# Patient Record
Sex: Male | Born: 2000
Health system: Southern US, Community
[De-identification: ages and names within clinical notes are randomized; demographics above are authoritative.]

---
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
## 2019-01-06 DIAGNOSIS — Z20828 Contact with and (suspected) exposure to other viral communicable diseases: Secondary | ICD-10-CM | POA: Diagnosis not present

## 2019-03-04 DIAGNOSIS — F909 Attention-deficit hyperactivity disorder, unspecified type: Secondary | ICD-10-CM | POA: Diagnosis not present

## 2019-05-12 DIAGNOSIS — F909 Attention-deficit hyperactivity disorder, unspecified type: Secondary | ICD-10-CM | POA: Diagnosis not present

## 2019-07-14 DIAGNOSIS — F909 Attention-deficit hyperactivity disorder, unspecified type: Secondary | ICD-10-CM | POA: Diagnosis not present

## 2019-09-08 DIAGNOSIS — F909 Attention-deficit hyperactivity disorder, unspecified type: Secondary | ICD-10-CM | POA: Diagnosis not present

## 2019-11-02 DIAGNOSIS — F909 Attention-deficit hyperactivity disorder, unspecified type: Secondary | ICD-10-CM | POA: Diagnosis not present

## 2019-12-28 DIAGNOSIS — F909 Attention-deficit hyperactivity disorder, unspecified type: Secondary | ICD-10-CM | POA: Diagnosis not present

## 2020-02-11 DIAGNOSIS — R0602 Shortness of breath: Secondary | ICD-10-CM | POA: Diagnosis not present

## 2020-02-11 DIAGNOSIS — E559 Vitamin D deficiency, unspecified: Secondary | ICD-10-CM | POA: Diagnosis not present

## 2020-02-22 DIAGNOSIS — F909 Attention-deficit hyperactivity disorder, unspecified type: Secondary | ICD-10-CM | POA: Diagnosis not present

## 2020-04-01 DIAGNOSIS — Z113 Encounter for screening for infections with a predominantly sexual mode of transmission: Secondary | ICD-10-CM | POA: Diagnosis not present

## 2020-04-01 DIAGNOSIS — Z23 Encounter for immunization: Secondary | ICD-10-CM | POA: Diagnosis not present

## 2020-04-08 DIAGNOSIS — Z23 Encounter for immunization: Secondary | ICD-10-CM | POA: Diagnosis not present

## 2020-05-16 DIAGNOSIS — F909 Attention-deficit hyperactivity disorder, unspecified type: Secondary | ICD-10-CM | POA: Diagnosis not present

## 2020-07-18 DIAGNOSIS — F909 Attention-deficit hyperactivity disorder, unspecified type: Secondary | ICD-10-CM | POA: Diagnosis not present

## 2020-08-25 ENCOUNTER — Encounter: Payer: Self-pay | Admitting: Emergency Medicine

## 2020-08-25 ENCOUNTER — Other Ambulatory Visit: Payer: Self-pay

## 2020-08-25 ENCOUNTER — Ambulatory Visit: Payer: BC Managed Care – PPO | Admitting: Emergency Medicine

## 2020-08-25 VITALS — BP 105/66 | HR 66 | Temp 98.8°F | Resp 16 | Ht 71.0 in | Wt 182.0 lb

## 2020-08-25 DIAGNOSIS — R400 Somnolence: Secondary | ICD-10-CM

## 2020-08-25 DIAGNOSIS — R41840 Attention and concentration deficit: Secondary | ICD-10-CM

## 2020-08-25 DIAGNOSIS — R638 Other symptoms and signs concerning food and fluid intake: Secondary | ICD-10-CM

## 2020-08-25 NOTE — Patient Instructions (Addendum)
   If you have lab work done today you will be contacted with your lab results within the next 2 weeks.  If you have not heard from us then please contact us. The fastest way to get your results is to register for My Chart.   IF you received an x-ray today, you will receive an invoice from [facility]. Please contact [facility] at [phone] with questions or concerns regarding your invoice.   IF you received labwork today, you will receive an invoice from [facility]. Please contact [facility] at [phone] with questions or concerns regarding your invoice.   Our billing staff will not be able to assist you with questions regarding bills from these companies.  You will be contacted with the lab results as soon as they are available. The fastest way to get your results is to activate your My Chart account. Instructions are located on the last page of this paperwork. If you have not heard from us regarding the results in 2 weeks, please contact this office.      Health Maintenance, Male Adopting a healthy lifestyle and getting preventive care are important in promoting health and wellness. Ask your health care provider about:  The right schedule for you to have regular tests and exams.  Things you can do on your own to prevent diseases and keep yourself healthy. What should I know about diet, weight, and exercise? Eat a healthy diet  Eat a diet that includes plenty of vegetables, fruits, low-fat dairy products, and lean protein.  Do not eat a lot of foods that are high in solid fats, added sugars, or sodium.   Maintain a healthy weight Body mass index (BMI) is a measurement that can be used to identify possible weight problems. It estimates body fat based on height and weight. Your health care provider can help determine your BMI and help you achieve or maintain a healthy weight. Get regular exercise Get regular exercise. This is one of the most important things you  can do for your health. Most adults should:  Exercise for at least 150 minutes each week. The exercise should increase your heart rate and make you sweat (moderate-intensity exercise).  Do strengthening exercises at least twice a week. This is in addition to the moderate-intensity exercise.  Spend less time sitting. Even light physical activity can be beneficial. Watch cholesterol and blood lipids Have your blood tested for lipids and cholesterol at 20 years of age, then have this test every 5 years. You may need to have your cholesterol levels checked more often if:  Your lipid or cholesterol levels are high.  You are older than 21 years of age.  You are at high risk for heart disease. What should I know about cancer screening? Many types of cancers can be detected early and may often be prevented. Depending on your health history and family history, you may need to have cancer screening at various ages. This may include screening for:  Colorectal cancer.  Prostate cancer.  Skin cancer.  Lung cancer. What should I know about heart disease, diabetes, and high blood pressure? Blood pressure and heart disease  High blood pressure causes heart disease and increases the risk of stroke. This is more likely to develop in people who have high blood pressure readings, are of African descent, or are overweight.  Talk with your health care provider about your target blood pressure readings.  Have your blood pressure checked: ? Every 3-5 years if you are   18-39 years of age. ? Every year if you are 40 years old or older.  If you are between the ages of 65 and 75 and are a current or former smoker, ask your health care provider if you should have a one-time screening for abdominal aortic aneurysm (AAA). Diabetes Have regular diabetes screenings. This checks your fasting blood sugar level. Have the screening done:  Once every three years after age 45 if you are at a normal weight and have  a low risk for diabetes.  More often and at a younger age if you are overweight or have a high risk for diabetes. What should I know about preventing infection? Hepatitis B If you have a higher risk for hepatitis B, you should be screened for this virus. Talk with your health care provider to find out if you are at risk for hepatitis B infection. Hepatitis C Blood testing is recommended for:  Everyone born from 1945 through 1965.  Anyone with known risk factors for hepatitis C. Sexually transmitted infections (STIs)  You should be screened each year for STIs, including gonorrhea and chlamydia, if: ? You are sexually active and are younger than 21 years of age. ? You are older than 21 years of age and your health care provider tells you that you are at risk for this type of infection. ? Your sexual activity has changed since you were last screened, and you are at increased risk for chlamydia or gonorrhea. Ask your health care provider if you are at risk.  Ask your health care provider about whether you are at high risk for HIV. Your health care provider may recommend a prescription medicine to help prevent HIV infection. If you choose to take medicine to prevent HIV, you should first get tested for HIV. You should then be tested every 3 months for as long as you are taking the medicine. Follow these instructions at home: Lifestyle  Do not use any products that contain nicotine or tobacco, such as cigarettes, e-cigarettes, and chewing tobacco. If you need help quitting, ask your health care provider.  Do not use street drugs.  Do not share needles.  Ask your health care provider for help if you need support or information about quitting drugs. Alcohol use  Do not drink alcohol if your health care provider tells you not to drink.  If you drink alcohol: ? Limit how much you have to 0-2 drinks a day. ? Be aware of how much alcohol is in your drink. In the [place] one drink equals one 12  oz bottle of beer (355 mL), one 5 oz glass of wine (148 mL), or one 1 oz glass of hard liquor (44 mL). General instructions  Schedule regular health, dental, and eye exams.  Stay current with your vaccines.  Tell your health care provider if: ? You often feel depressed. ? You have ever been abused or do not feel safe at home. Summary  Adopting a healthy lifestyle and getting preventive care are important in promoting health and wellness.  Follow your health care provider's instructions about healthy diet, exercising, and getting tested or screened for diseases.  Follow your health care provider's instructions on monitoring your cholesterol and blood pressure. This information is not intended to replace advice given to you by your health care provider. Make sure you discuss any questions you have with your health care provider. Document Revised: 06/04/2018 Document Reviewed: 06/04/2018 Elsevier Patient Education  2021 [employer]  

## 2020-08-25 NOTE — Progress Notes (Signed)
Elijah Murray 20 y.o.   Chief Complaint  Patient presents with  . lab work    Per patient to check thyroid and Vitamin D and sleeping a lot    HISTORY OF PRESENT ILLNESS: This is a 20 y.o. male first visit with me. No known chronic medical problems. Parents concerned about excessive daytime sleeping, laziness, cravings for sweets, problems concentrating, anger issues. Patient states he has cravings for sweets and erratic pattern of sleep.  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  HPI   Prior to Admission medications   Medication Sig Start Date End Date Taking? Authorizing Provider  cyclobenzaprine (FLEXERIL) 5 MG tablet Take 1 tablet (5 mg total) by mouth 3 (three) times daily as needed for muscle spasms. Patient not taking: Reported on 08/25/2020 11/02/16   Ofilia Neas, PA-C  naproxen (NAPROSYN) 500 MG tablet Take 1 tablet (500 mg total) by mouth 2 (two) times daily with a meal. Patient not taking: Reported on 08/25/2020 11/02/16   Ofilia Neas, PA-C    No Known Allergies  There are no problems to display for this patient.   No past medical history on file.    Social History   Socioeconomic History  . Marital status: Single    Spouse name: Not on file  . Number of children: Not on file  . Years of education: Not on file  . Highest education level: Not on file  Occupational History  . Not on file  Tobacco Use  . Smoking status: Never Smoker  . Smokeless tobacco: Never Used  Substance and Sexual Activity  . Alcohol use: No  . Drug use: No  . Sexual activity: Not on file  Other Topics Concern  . Not on file  Social History Narrative  . Not on file   Social Determinants of Health   Financial Resource Strain: Not on file  Food Insecurity: Not on file  Transportation Needs: Not on file  Physical Activity: Not on file  Stress: Not on file  Social Connections: Not on file   Intimate Partner Violence: Not on file    No family history on file.   Review of Systems  Constitutional: Negative.  Negative for chills and fever.  HENT: Negative.  Negative for congestion and sore throat.   Respiratory: Negative.  Negative for cough and shortness of breath.   Cardiovascular: Negative.  Negative for chest pain and palpitations.  Gastrointestinal: Negative.  Negative for abdominal pain, diarrhea, nausea and vomiting.  Genitourinary: Negative.  Negative for hematuria.  Musculoskeletal: Negative for back pain, joint pain and myalgias.  Skin: Negative.  Negative for rash.  Neurological: Negative.  Negative for dizziness, speech change, focal weakness, loss of consciousness and headaches.  Psychiatric/Behavioral: Negative for depression and substance abuse. The patient is not nervous/anxious.   All other systems reviewed and are negative.    Today's Vitals   08/25/20 1528  BP: 105/66  Pulse: 66  Resp: 16  Temp: 98.8 F (37.1 C)  TempSrc: Temporal  SpO2: 98%  Weight: 182 lb (82.6 kg)  Height: 5\' 11"  (1.803 m)   Body mass index is 25.38 kg/m.   Physical Exam Vitals reviewed.  Constitutional:      Appearance: Normal appearance.  HENT:     Head: Normocephalic.     Mouth/Throat:     Mouth: Mucous membranes are moist.     Pharynx: Oropharynx is clear.  Eyes:  Extraocular Movements: Extraocular movements intact.     Conjunctiva/sclera: Conjunctivae normal.     Pupils: Pupils are equal, round, and reactive to light.  Cardiovascular:     Rate and Rhythm: Normal rate and regular rhythm.     Pulses: Normal pulses.     Heart sounds: Normal heart sounds.  Pulmonary:     Effort: Pulmonary effort is normal.     Breath sounds: Normal breath sounds.  Musculoskeletal:        General: Normal range of motion.     Cervical back: Normal range of motion and neck supple. No tenderness.     Right lower leg: No edema.     Left lower leg: No edema.   Lymphadenopathy:     Cervical: No cervical adenopathy.  Skin:    General: Skin is warm and dry.     Capillary Refill: Capillary refill takes less than 2 seconds.  Neurological:     General: No focal deficit present.     Mental Status: He is alert and oriented to person, place, and time.  Psychiatric:        Mood and Affect: Mood normal.        Behavior: Behavior normal.      ASSESSMENT & PLAN: Elijah Murray was seen today for lab work.  Diagnoses and all orders for this visit:  Craving for particular food -     VITAMIN D 25 Hydroxy (Vit-D Deficiency, Fractures) -     Vitamin B12 -     TSH -     Hemoglobin A1c -     Comprehensive metabolic panel -     CBC with Differential/Platelet  Daytime sleepiness -     VITAMIN D 25 Hydroxy (Vit-D Deficiency, Fractures) -     Vitamin B12 -     TSH -     Hemoglobin A1c -     Comprehensive metabolic panel -     CBC with Differential/Platelet  Concentration deficit -     VITAMIN D 25 Hydroxy (Vit-D Deficiency, Fractures) -     Vitamin B12 -     TSH -     Hemoglobin A1c -     Comprehensive metabolic panel -     CBC with Differential/Platelet    Patient Instructions       If you have lab work done today you will be contacted with your lab results within the next 2 weeks.  If you have not heard from [place] then please contact us. The fastest way to get your results is to register for My Chart.   IF you received an x-ray today, you will receive an invoice from [facility]. Please contact [facility] at [phone] with questions or concerns regarding your invoice.   IF you received labwork today, you will receive an invoice from [place]. Please contact [facility] at [phone] with questions or concerns regarding your invoice.   Our billing staff will not be able to assist you with questions regarding bills from these companies.  You will be contacted with the lab results as soon as they are available. The fastest  way to get your results is to activate your My Chart account. Instructions are located on the last page of this paperwork. If you have not heard from [place] regarding the results in 2 weeks, please contact this office.     Health Maintenance, Male Adopting a healthy lifestyle and getting preventive care are important in promoting health and wellness. Ask your health care provider  about:  The right schedule for you to have regular tests and exams.  Things you can do on your own to prevent diseases and keep yourself healthy. What should I know about diet, weight, and exercise? Eat a healthy diet  Eat a diet that includes plenty of vegetables, fruits, low-fat dairy products, and lean protein.  Do not eat a lot of foods that are high in solid fats, added sugars, or sodium.   Maintain a healthy weight Body mass index (BMI) is a measurement that can be used to identify possible weight problems. It estimates body fat based on height and weight. Your health care provider can help determine your BMI and help you achieve or maintain a healthy weight. Get regular exercise Get regular exercise. This is one of the most important things you can do for your health. Most adults should:  Exercise for at least 150 minutes each week. The exercise should increase your heart rate and make you sweat (moderate-intensity exercise).  Do strengthening exercises at least twice a week. This is in addition to the moderate-intensity exercise.  Spend less time sitting. Even light physical activity can be beneficial. Watch cholesterol and blood lipids Have your blood tested for lipids and cholesterol at 21 years of age, then have this test every 5 years. You may need to have your cholesterol levels checked more often if:  Your lipid or cholesterol levels are high.  You are older than 21 years of age.  You are at high risk for heart disease. What should I know about cancer screening? Many types of cancers can be  detected early and may often be prevented. Depending on your health history and family history, you may need to have cancer screening at various ages. This may include screening for:  Colorectal cancer.  Prostate cancer.  Skin cancer.  Lung cancer. What should I know about heart disease, diabetes, and high blood pressure? Blood pressure and heart disease  High blood pressure causes heart disease and increases the risk of stroke. This is more likely to develop in people who have high blood pressure readings, are of African descent, or are overweight.  Talk with your health care provider about your target blood pressure readings.  Have your blood pressure checked: ? Every 3-5 years if you are 3718-21 years of age. ? Every year if you are 21 years old or older.  If you are between the ages of 2365 and 8275 and are a current or former smoker, ask your health care provider if you should have a one-time screening for abdominal aortic aneurysm (AAA). Diabetes Have regular diabetes screenings. This checks your fasting blood sugar level. Have the screening done:  Once every three years after age 21 if you are at a normal weight and have a low risk for diabetes.  More often and at a younger age if you are overweight or have a high risk for diabetes. What should I know about preventing infection? Hepatitis B If you have a higher risk for hepatitis B, you should be screened for this virus. Talk with your health care provider to find out if you are at risk for hepatitis B infection. Hepatitis C Blood testing is recommended for:  Everyone born from [birth year] through 1966.  Anyone with known risk factors for hepatitis C. Sexually transmitted infections (STIs)  You should be screened each year for STIs, including gonorrhea and chlamydia, if: ? You are sexually active and are younger than 21 years of age. ? You  are older than 21 years of age and your health care provider tells you that you are at risk  for this type of infection. ? Your sexual activity has changed since you were last screened, and you are at increased risk for chlamydia or gonorrhea. Ask your health care provider if you are at risk.  Ask your health care provider about whether you are at high risk for HIV. Your health care provider may recommend a prescription medicine to help prevent HIV infection. If you choose to take medicine to prevent HIV, you should first get tested for HIV. You should then be tested every 3 months for as long as you are taking the medicine. Follow these instructions at home: Lifestyle  Do not use any products that contain nicotine or tobacco, such as cigarettes, e-cigarettes, and chewing tobacco. If you need help quitting, ask your health care provider.  Do not use street drugs.  Do not share needles.  Ask your health care provider for help if you need support or information about quitting drugs. Alcohol use  Do not drink alcohol if your health care provider tells you not to drink.  If you drink alcohol: ? Limit how much you have to 0-2 drinks a day. ? Be aware of how much alcohol is in your drink. In the [place] one drink equals one 12 oz bottle of beer (355 mL), one 5 oz glass of wine (148 mL), or one 1 oz glass of hard liquor (44 mL). General instructions  Schedule regular health, dental, and eye exams.  Stay current with your vaccines.  Tell your health care provider if: ? You often feel depressed. ? You have ever been abused or do not feel safe at home. Summary  Adopting a healthy lifestyle and getting preventive care are important in promoting health and wellness.  Follow your health care provider's instructions about healthy diet, exercising, and getting tested or screened for diseases.  Follow your health care provider's instructions on monitoring your cholesterol and blood pressure. This information is not intended to replace advice given to you by your health care provider.  Make sure you discuss any questions you have with your health care provider. Document Revised: 06/04/2018 Document Reviewed: 06/04/2018 Elsevier Patient Education  2021 Elsevier Inc.      Edwina Barth, MD [facility]

## 2020-08-26 LAB — HEMOGLOBIN A1C
Est. average glucose Bld gHb Est-mCnc: 103 mg/dL
Hgb A1c MFr Bld: 5.2 % (ref 4.8–5.6)

## 2020-08-26 LAB — CBC WITH DIFFERENTIAL/PLATELET
Basophils Absolute: 0 10*3/uL (ref 0.0–0.2)
Basos: 1 %
EOS (ABSOLUTE): 0.1 10*3/uL (ref 0.0–0.4)
Eos: 2 %
Hematocrit: 44.8 % (ref 37.5–51.0)
Hemoglobin: 15.2 g/dL (ref 13.0–17.7)
Immature Grans (Abs): 0 10*3/uL (ref 0.0–0.1)
Immature Granulocytes: 0 %
Lymphocytes Absolute: 2.2 10*3/uL (ref 0.7–3.1)
Lymphs: 34 %
MCH: 28.2 pg (ref 26.6–33.0)
MCHC: 33.9 g/dL (ref 31.5–35.7)
MCV: 83 fL (ref 79–97)
Monocytes Absolute: 0.6 10*3/uL (ref 0.1–0.9)
Monocytes: 9 %
Neutrophils Absolute: 3.6 10*3/uL (ref 1.4–7.0)
Neutrophils: 54 %
Platelets: 222 10*3/uL (ref 150–450)
RBC: 5.39 x10E6/uL (ref 4.14–5.80)
RDW: 12.8 % (ref 11.6–15.4)
WBC: 6.6 10*3/uL (ref 3.4–10.8)

## 2020-08-26 LAB — TSH: TSH: 1.52 u[IU]/mL (ref 0.450–4.500)

## 2020-08-26 LAB — COMPREHENSIVE METABOLIC PANEL
ALT: 27 IU/L (ref 0–44)
AST: 28 IU/L (ref 0–40)
Albumin/Globulin Ratio: 1.8 (ref 1.2–2.2)
Albumin: 4.7 g/dL (ref 4.1–5.2)
Alkaline Phosphatase: 103 IU/L (ref 51–125)
BUN/Creatinine Ratio: 11 (ref 9–20)
BUN: 10 mg/dL (ref 6–20)
Bilirubin Total: 0.3 mg/dL (ref 0.0–1.2)
CO2: 20 mmol/L (ref 20–29)
Calcium: 9.8 mg/dL (ref 8.7–10.2)
Chloride: 101 mmol/L (ref 96–106)
Creatinine, Ser: 0.95 mg/dL (ref 0.76–1.27)
Globulin, Total: 2.6 g/dL (ref 1.5–4.5)
Glucose: 90 mg/dL (ref 65–99)
Potassium: 4.4 mmol/L (ref 3.5–5.2)
Sodium: 141 mmol/L (ref 134–144)
Total Protein: 7.3 g/dL (ref 6.0–8.5)
eGFR: 118 mL/min/{1.73_m2} (ref >59)

## 2020-08-26 LAB — VITAMIN B12: Vitamin B-12: 327 pg/mL (ref 232–1245)

## 2020-08-26 LAB — VITAMIN D 25 HYDROXY (VIT D DEFICIENCY, FRACTURES): Vit D, 25-Hydroxy: 39.7 ng/mL (ref 30.0–100.0)

## 2020-08-30 ENCOUNTER — Telehealth: Payer: Self-pay | Admitting: Emergency Medicine

## 2020-08-30 NOTE — Telephone Encounter (Signed)
Patients father is calling in asking for recent lab results .  I did not see a current DPR please reach out to  patient  With recent lab results   [phone] dads number  Dad is aware that we will need to speak with son

## 2020-09-12 DIAGNOSIS — F909 Attention-deficit hyperactivity disorder, unspecified type: Secondary | ICD-10-CM | POA: Diagnosis not present

## 2021-03-01 DIAGNOSIS — F909 Attention-deficit hyperactivity disorder, unspecified type: Secondary | ICD-10-CM | POA: Diagnosis not present

## 2021-04-26 DIAGNOSIS — F3481 Disruptive mood dysregulation disorder: Secondary | ICD-10-CM | POA: Diagnosis not present

## 2021-06-27 ENCOUNTER — Encounter: Payer: Self-pay | Admitting: Emergency Medicine

## 2021-06-27 ENCOUNTER — Other Ambulatory Visit: Payer: Self-pay

## 2021-06-27 ENCOUNTER — Ambulatory Visit (INDEPENDENT_AMBULATORY_CARE_PROVIDER_SITE_OTHER): Payer: BC Managed Care – PPO | Admitting: Emergency Medicine

## 2021-06-27 VITALS — BP 118/60 | HR 70 | Ht 71.0 in | Wt 181.0 lb

## 2021-06-27 DIAGNOSIS — Z13228 Encounter for screening for other metabolic disorders: Secondary | ICD-10-CM

## 2021-06-27 DIAGNOSIS — Z0000 Encounter for general adult medical examination without abnormal findings: Secondary | ICD-10-CM | POA: Diagnosis not present

## 2021-06-27 DIAGNOSIS — Z13220 Encounter for screening for lipoid disorders: Secondary | ICD-10-CM

## 2021-06-27 DIAGNOSIS — Z130 Encounter for screening for diseases of the blood and blood-forming organs and certain disorders involving the immune mechanism: Secondary | ICD-10-CM | POA: Diagnosis not present

## 2021-06-27 DIAGNOSIS — Z1329 Encounter for screening for other suspected endocrine disorder: Secondary | ICD-10-CM | POA: Diagnosis not present

## 2021-06-27 DIAGNOSIS — Z1159 Encounter for screening for other viral diseases: Secondary | ICD-10-CM | POA: Diagnosis not present

## 2021-06-27 DIAGNOSIS — Z114 Encounter for screening for human immunodeficiency virus [HIV]: Secondary | ICD-10-CM | POA: Diagnosis not present

## 2021-06-27 LAB — CBC WITH DIFFERENTIAL/PLATELET
Basophils Absolute: 0 10*3/uL (ref 0.0–0.1)
Basophils Relative: 0.3 % (ref 0.0–3.0)
Eosinophils Absolute: 0.1 10*3/uL (ref 0.0–0.7)
Eosinophils Relative: 1 % (ref 0.0–5.0)
HCT: 44.2 % (ref 39.0–52.0)
Hemoglobin: 15.1 g/dL (ref 13.0–17.0)
Lymphocytes Relative: 27 % (ref 12.0–46.0)
Lymphs Abs: 1.8 10*3/uL (ref 0.7–4.0)
MCHC: 34.1 g/dL (ref 30.0–36.0)
MCV: 81 fl (ref 78.0–100.0)
Monocytes Absolute: 0.6 10*3/uL (ref 0.1–1.0)
Monocytes Relative: 9.6 % (ref 3.0–12.0)
Neutro Abs: 4.2 10*3/uL (ref 1.4–7.7)
Neutrophils Relative %: 62.1 % (ref 43.0–77.0)
Platelets: 219 10*3/uL (ref 150.0–400.0)
RBC: 5.46 Mil/uL (ref 4.22–5.81)
RDW: 13.6 % (ref 11.5–15.5)
WBC: 6.7 10*3/uL (ref 4.0–10.5)

## 2021-06-27 LAB — COMPREHENSIVE METABOLIC PANEL
ALT: 23 U/L (ref 0–53)
AST: 22 U/L (ref 0–37)
Albumin: 4.9 g/dL (ref 3.5–5.2)
Alkaline Phosphatase: 96 U/L (ref 39–117)
BUN: 9 mg/dL (ref 6–23)
CO2: 28 mEq/L (ref 19–32)
Calcium: 9.9 mg/dL (ref 8.4–10.5)
Chloride: 101 mEq/L (ref 96–112)
Creatinine, Ser: 0.95 mg/dL (ref 0.40–1.50)
GFR: 114.73 mL/min (ref >60.00)
Glucose, Bld: 81 mg/dL (ref 70–99)
Potassium: 3.7 mEq/L (ref 3.5–5.1)
Sodium: 138 mEq/L (ref 135–145)
Total Bilirubin: 0.5 mg/dL (ref 0.2–1.2)
Total Protein: 7.9 g/dL (ref 6.0–8.3)

## 2021-06-27 LAB — HEMOGLOBIN A1C: Hgb A1c MFr Bld: 5.2 % (ref 4.6–6.5)

## 2021-06-27 LAB — LIPID PANEL
Cholesterol: 179 mg/dL (ref 0–200)
HDL: 36.3 mg/dL — ABNORMAL LOW (ref >39.00)
LDL Cholesterol: 104 mg/dL — ABNORMAL HIGH (ref 0–99)
NonHDL: 143.14
Total CHOL/HDL Ratio: 5
Triglycerides: 194 mg/dL — ABNORMAL HIGH (ref 0.0–149.0)
VLDL: 38.8 mg/dL (ref 0.0–40.0)

## 2021-06-27 NOTE — Patient Instructions (Signed)

## 2021-06-27 NOTE — Progress Notes (Signed)
Elijah Murray 20 y.o.   Chief Complaint  Patient presents with   New Patient (Initial Visit)    physical    HISTORY OF PRESENT ILLNESS: This is a 21 y.o. male here for annual physical. Healthy male with a healthy lifestyle.  HPI   Prior to Admission medications   Medication Sig Start Date End Date Taking? Authorizing Provider  cyclobenzaprine (FLEXERIL) 5 MG tablet Take 1 tablet (5 mg total) by mouth 3 (three) times daily as needed for muscle spasms. Patient not taking: Reported on 08/25/2020 11/02/16   Ofilia Neas, PA-C  naproxen (NAPROSYN) 500 MG tablet Take 1 tablet (500 mg total) by mouth 2 (two) times daily with a meal. Patient not taking: Reported on 08/25/2020 11/02/16   Ofilia Neas, PA-C    No Known Allergies  There are no problems to display for this patient.   No past medical history on file.  No past surgical history on file.  Social History   Socioeconomic History   Marital status: Single    Spouse name: Not on file   Number of children: Not on file   Years of education: Not on file   Highest education level: Not on file  Occupational History   Not on file  Tobacco Use   Smoking status: Never   Smokeless tobacco: Never  Substance and Sexual Activity   Alcohol use: No   Drug use: No   Sexual activity: Not on file  Other Topics Concern   Not on file  Social History Narrative   Not on file   Social Determinants of Health   Financial Resource Strain: Not on file  Food Insecurity: Not on file  Transportation Needs: Not on file  Physical Activity: Not on file  Stress: Not on file  Social Connections: Not on file  Intimate Partner Violence: Not on file    No family history on file.   Review of Systems  Constitutional: Negative.  Negative for chills and fever.  HENT: Negative.  Negative for congestion and sore throat.   Eyes: Negative.   Respiratory: Negative.  Negative for cough and shortness of breath.   Cardiovascular: Negative.   Negative for chest pain and palpitations.  Gastrointestinal: Negative.  Negative for abdominal pain, diarrhea, nausea and vomiting.  Genitourinary: Negative.   Skin: Negative.  Negative for rash.  Neurological: Negative.  Negative for dizziness and headaches.  All other systems reviewed and are negative.   Physical Exam Vitals reviewed.  Constitutional:      Appearance: Normal appearance.  HENT:     Head: Normocephalic.     Right Ear: Tympanic membrane, ear canal and external ear normal.     Left Ear: Tympanic membrane, ear canal and external ear normal.     Mouth/Throat:     Mouth: Mucous membranes are moist.     Pharynx: Oropharynx is clear.  Eyes:     Extraocular Movements: Extraocular movements intact.     Conjunctiva/sclera: Conjunctivae normal.     Pupils: Pupils are equal, round, and reactive to light.  Cardiovascular:     Rate and Rhythm: Normal rate and regular rhythm.     Pulses: Normal pulses.     Heart sounds: Normal heart sounds.  Pulmonary:     Effort: Pulmonary effort is normal.     Breath sounds: Normal breath sounds.  Musculoskeletal:        General: Normal range of motion.     Cervical back: Normal range of motion and  neck supple. No tenderness.     Right lower leg: No edema.     Left lower leg: No edema.  Lymphadenopathy:     Cervical: No cervical adenopathy.  Skin:    General: Skin is warm and dry.     Capillary Refill: Capillary refill takes less than 2 seconds.  Neurological:     General: No focal deficit present.     Mental Status: He is alert and oriented to person, place, and time.  Psychiatric:        Mood and Affect: Mood normal.        Behavior: Behavior normal.     ASSESSMENT & PLAN: Problem List Items Addressed This Visit   None Visit Diagnoses     Routine general medical examination at a health care facility    -  Primary   Need for hepatitis C screening test       Relevant Orders   Hepatitis C antibody screen   Screening for  HIV (human immunodeficiency virus)       Relevant Orders   HIV antibody   Screening for deficiency anemia       Relevant Orders   CBC with Differential   Screening for lipoid disorders       Relevant Orders   Lipid panel   Screening for endocrine, metabolic and immunity disorder       Relevant Orders   Comprehensive metabolic panel   Hemoglobin A1c      Modifiable risk factors discussed with patient. Anticipatory guidance according to age provided. The following topics were also discussed: Social Determinants of Health Smoking.  Non-smoker Diet and nutrition Benefits of exercise Cancer family history review Vaccinations recommendations Cardiovascular risk assessment Blood work done today Mental health including depression and anxiety Fall and accident prevention  Patient Instructions  Health Maintenance, Male Adopting a healthy lifestyle and getting preventive care are important in promoting health and wellness. Ask your health care provider about: The right schedule for you to have regular tests and exams. Things you can do on your own to prevent diseases and keep yourself healthy. What should I know about diet, weight, and exercise? Eat a healthy diet  Eat a diet that includes plenty of vegetables, fruits, low-fat dairy products, and lean protein. Do not eat a lot of foods that are high in solid fats, added sugars, or sodium. Maintain a healthy weight Body mass index (BMI) is a measurement that can be used to identify possible weight problems. It estimates body fat based on height and weight. Your health care provider can help determine your BMI and help you achieve or maintain a healthy weight. Get regular exercise Get regular exercise. This is one of the most important things you can do for your health. Most adults should: Exercise for at least 150 minutes each week. The exercise should increase your heart rate and make you sweat (moderate-intensity exercise). Do  strengthening exercises at least twice a week. This is in addition to the moderate-intensity exercise. Spend less time sitting. Even light physical activity can be beneficial. Watch cholesterol and blood lipids Have your blood tested for lipids and cholesterol at 22 years of age, then have this test every 5 years. You may need to have your cholesterol levels checked more often if: Your lipid or cholesterol levels are high. You are older than 22 years of age. You are at high risk for heart disease. What should I know about cancer screening? Many types of cancers can be  detected early and may often be prevented. Depending on your health history and family history, you may need to have cancer screening at various ages. This may include screening for: Colorectal cancer. Prostate cancer. Skin cancer. Lung cancer. What should I know about heart disease, diabetes, and high blood pressure? Blood pressure and heart disease High blood pressure causes heart disease and increases the risk of stroke. This is more likely to develop in people who have high blood pressure readings or are overweight. Talk with your health care provider about your target blood pressure readings. Have your blood pressure checked: Every 3-5 years if you are 1318-22 years of age. Every year if you are 582 years old or older. If you are between the ages of 7165 and 2875 and are a current or former smoker, ask your health care provider if you should have a one-time screening for abdominal aortic aneurysm (AAA). Diabetes Have regular diabetes screenings. This checks your fasting blood sugar level. Have the screening done: Once every three years after age 10445 if you are at a normal weight and have a low risk for diabetes. More often and at a younger age if you are overweight or have a high risk for diabetes. What should I know about preventing infection? Hepatitis B If you have a higher risk for hepatitis B, you should be screened for  this virus. Talk with your health care provider to find out if you are at risk for hepatitis B infection. Hepatitis C Blood testing is recommended for: Everyone born from [birth year] through 1966. Anyone with known risk factors for hepatitis C. Sexually transmitted infections (STIs) You should be screened each year for STIs, including gonorrhea and chlamydia, if: You are sexually active and are younger than 22 years of age. You are older than 22 years of age and your health care provider tells you that you are at risk for this type of infection. Your sexual activity has changed since you were last screened, and you are at increased risk for chlamydia or gonorrhea. Ask your health care provider if you are at risk. Ask your health care provider about whether you are at high risk for HIV. Your health care provider may recommend a prescription medicine to help prevent HIV infection. If you choose to take medicine to prevent HIV, you should first get tested for HIV. You should then be tested every 3 months for as long as you are taking the medicine. Follow these instructions at home: Alcohol use Do not drink alcohol if your health care provider tells you not to drink. If you drink alcohol: Limit how much you have to 0-2 drinks a day. Know how much alcohol is in your drink. In the [place] one drink equals one 12 oz bottle of beer (355 mL), one 5 oz glass of wine (148 mL), or one 1 oz glass of hard liquor (44 mL). Lifestyle Do not use any products that contain nicotine or tobacco. These products include cigarettes, chewing tobacco, and vaping devices, such as e-cigarettes. If you need help quitting, ask your health care provider. Do not use street drugs. Do not share needles. Ask your health care provider for help if you need support or information about quitting drugs. General instructions Schedule regular health, dental, and eye exams. Stay current with your vaccines. Tell your health care provider  if: You often feel depressed. You have ever been abused or do not feel safe at home. Summary Adopting a healthy lifestyle and getting preventive care are  important in promoting health and wellness. Follow your health care provider's instructions about healthy diet, exercising, and getting tested or screened for diseases. Follow your health care provider's instructions on monitoring your cholesterol and blood pressure. This information is not intended to replace advice given to you by your health care provider. Make sure you discuss any questions you have with your health care provider. Document Revised: 10/31/2020 Document Reviewed: 10/31/2020 Elsevier Patient Education  2022 Elsevier Inc.     Edwina Barth, MD [facility]

## 2021-06-28 LAB — HEPATITIS C ANTIBODY
Hepatitis C Ab: NONREACTIVE
SIGNAL TO CUT-OFF: 0.05 (ref <1.00)

## 2021-06-28 LAB — HIV ANTIBODY (ROUTINE TESTING W REFLEX): HIV 1&2 Ab, 4th Generation: NONREACTIVE

## 2021-07-04 DIAGNOSIS — F909 Attention-deficit hyperactivity disorder, unspecified type: Secondary | ICD-10-CM | POA: Diagnosis not present

## 2021-07-19 ENCOUNTER — Telehealth: Payer: Self-pay

## 2021-07-19 NOTE — Telephone Encounter (Signed)
Pt father called in for test results for son bc they haven't heard the results.  Pt father expressed understanding he would relay information to pt.   FYI

## 2021-07-21 NOTE — Telephone Encounter (Signed)
Called and spoke with pt father about lab results.

## 2021-08-29 DIAGNOSIS — F909 Attention-deficit hyperactivity disorder, unspecified type: Secondary | ICD-10-CM | POA: Diagnosis not present

## 2021-10-30 DIAGNOSIS — F909 Attention-deficit hyperactivity disorder, unspecified type: Secondary | ICD-10-CM | POA: Diagnosis not present

## 2021-12-07 ENCOUNTER — Other Ambulatory Visit (HOSPITAL_COMMUNITY): Payer: Self-pay | Admitting: Psychiatry

## 2021-12-27 DIAGNOSIS — F3481 Disruptive mood dysregulation disorder: Secondary | ICD-10-CM | POA: Diagnosis not present

## 2022-02-19 DIAGNOSIS — F909 Attention-deficit hyperactivity disorder, unspecified type: Secondary | ICD-10-CM | POA: Diagnosis not present

## 2022-04-16 DIAGNOSIS — F909 Attention-deficit hyperactivity disorder, unspecified type: Secondary | ICD-10-CM | POA: Diagnosis not present

## 2022-06-05 DIAGNOSIS — F3481 Disruptive mood dysregulation disorder: Secondary | ICD-10-CM | POA: Diagnosis not present

## 2022-08-02 DIAGNOSIS — F909 Attention-deficit hyperactivity disorder, unspecified type: Secondary | ICD-10-CM | POA: Diagnosis not present

## 2022-10-02 DIAGNOSIS — F909 Attention-deficit hyperactivity disorder, unspecified type: Secondary | ICD-10-CM | POA: Diagnosis not present

## 2022-11-26 DIAGNOSIS — F909 Attention-deficit hyperactivity disorder, unspecified type: Secondary | ICD-10-CM | POA: Diagnosis not present
